# Patient Record
Sex: Female | Born: 2000 | Race: Black or African American | Hispanic: No | Marital: Single | Smoking: Never smoker
Health system: Southern US, Community
[De-identification: ages and names within clinical notes are randomized; demographics above are authoritative.]

## PROBLEM LIST (undated history)

## (undated) DIAGNOSIS — E119 Type 2 diabetes mellitus without complications: Secondary | ICD-10-CM

## (undated) HISTORY — DX: Type 2 diabetes mellitus without complications: E11.9

---
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
## 2020-02-02 ENCOUNTER — Ambulatory Visit: Payer: No Typology Code available for payment source | Attending: Internal Medicine

## 2020-02-02 DIAGNOSIS — Z23 Encounter for immunization: Secondary | ICD-10-CM

## 2020-02-02 NOTE — Progress Notes (Signed)
   Covid-19 Vaccination Clinic  Name:  Tammy Meyer    MRN: [id number] DOB: 03/27/2001  02/02/2020  [name] was observed post Covid-19 immunization for 15 minutes without incident. She was provided with Vaccine Information Sheet and instruction to access the V-Safe system.   [name] was instructed to call 911 with any severe reactions post vaccine: Marland Kitchen Difficulty breathing  . Swelling of face and throat  . A fast heartbeat  . A bad rash all over body  . Dizziness and weakness   Immunizations Administered    Name Date Dose VIS Date Route   Pfizer COVID-19 Vaccine 02/02/2020 10:52 AM 0.3 mL 12/05/2018 Intramuscular   Manufacturer: [employer], [employer]   Lot: [id number]   NDC: [address]-3

## 2020-02-25 ENCOUNTER — Ambulatory Visit: Payer: No Typology Code available for payment source | Attending: Internal Medicine

## 2020-02-25 DIAGNOSIS — Z23 Encounter for immunization: Secondary | ICD-10-CM

## 2020-02-25 NOTE — Progress Notes (Signed)
   Covid-19 Vaccination Clinic  Name:  Tammy Meyer    MRN: [id number] DOB: [birth year]  02/25/2020  Ms. [name] was observed post Covid-19 immunization for 15 minutes without incident. She was provided with Vaccine Information Sheet and instruction to access the V-Safe system.   Ms. [name] was instructed to call 911 with any severe reactions post vaccine: Marland Kitchen Difficulty breathing  . Swelling of face and throat  . A fast heartbeat  . A bad rash all over body  . Dizziness and weakness   Immunizations Administered    Name Date Dose VIS Date Route   Pfizer COVID-19 Vaccine 02/25/2020  2:12 PM 0.3 mL 12/05/2018 Intramuscular   Manufacturer: [employer], [employer]   Lot: [id number]   NDC: [address]-9

## 2020-08-15 DIAGNOSIS — Z68.41 Body mass index (BMI) pediatric, 85th percentile to less than 95th percentile for age: Secondary | ICD-10-CM | POA: Diagnosis not present

## 2021-06-07 IMAGING — DX DG CHEST 1V PORT
1 series · 1 of 1 positions shown · non-contrast
Comparison: None.

CLINICAL DATA: Chest pain.  Recent DPT8W-1V exposure.

EXAM:
PORTABLE CHEST 1 VIEW

[chest ap]
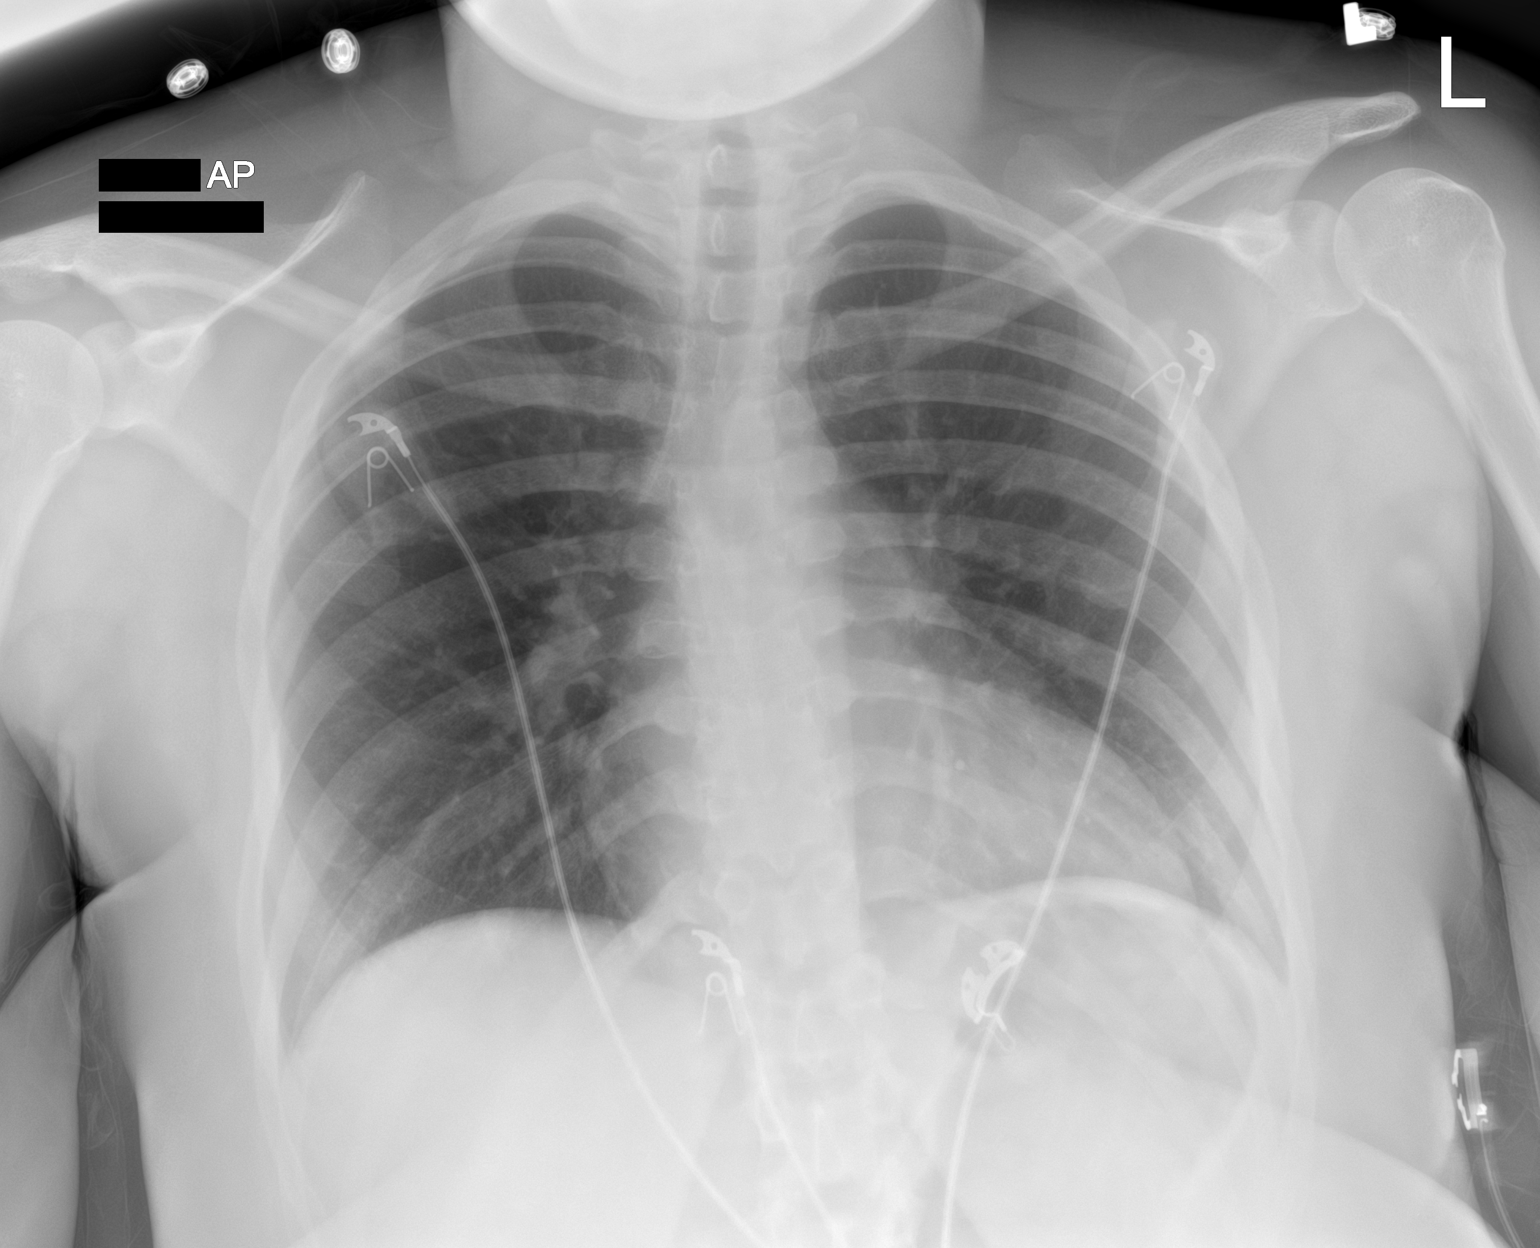

[1 of 1 positions shown; findings below may reference images not displayed]

FINDINGS: The heart size and mediastinal contours are within normal limits.
Both lungs are clear. The visualized skeletal structures are
unremarkable.
IMPRESSION: No active disease.

## 2021-09-28 ENCOUNTER — Ambulatory Visit (INDEPENDENT_AMBULATORY_CARE_PROVIDER_SITE_OTHER): Payer: Medicaid Other | Admitting: Women's Health

## 2021-09-28 ENCOUNTER — Encounter: Payer: Self-pay | Admitting: Women's Health

## 2021-09-28 ENCOUNTER — Other Ambulatory Visit: Payer: Self-pay

## 2021-09-28 VITALS — BP 109/70 | HR 84 | Ht 61.0 in | Wt 187.0 lb

## 2021-09-28 DIAGNOSIS — Z3202 Encounter for pregnancy test, result negative: Secondary | ICD-10-CM

## 2021-09-28 DIAGNOSIS — Z3009 Encounter for other general counseling and advice on contraception: Secondary | ICD-10-CM | POA: Diagnosis not present

## 2021-09-28 DIAGNOSIS — Z113 Encounter for screening for infections with a predominantly sexual mode of transmission: Secondary | ICD-10-CM

## 2021-09-28 LAB — POCT URINE PREGNANCY: Preg Test, Ur: NEGATIVE

## 2021-09-28 NOTE — Progress Notes (Signed)
° °  GYN VISIT Patient name: Tammy Meyer MRN [id number]  Date of birth: [birth year] Chief Complaint:   discuss IUD (Had sex yesterday)  History of Present Illness:   Tammy Meyer is a 20 y.o. G0 African-American female being seen today to discuss birth control. Wants IUD, discussed types, wants Mirena. Had sex yesterday, used a condom.    Patient's last menstrual period was 09/10/2021. The current method of family planning is condoms.  Last pap <21yo. Results were: N/A  Depression screen [facility] 2/9 09/28/2021  Decreased Interest 0  Down, Depressed, Hopeless 0  PHQ - 2 Score 0  Altered sleeping 0  Tired, decreased energy 0  Change in appetite 0  Feeling bad or failure about yourself  0  Trouble concentrating 0  Moving slowly or fidgety/restless 0  Suicidal thoughts 0  PHQ-9 Score 0     GAD 7 : Generalized Anxiety Score 09/28/2021  Nervous, Anxious, on Edge 0  Control/stop worrying 0  Worry too much - different things 0  Trouble relaxing 0  Restless 0  Easily annoyed or irritable 0  Afraid - awful might happen 0  Total GAD 7 Score 0     Review of Systems:   Pertinent items are noted in HPI Denies fever/chills, dizziness, headaches, visual disturbances, fatigue, shortness of breath, chest pain, abdominal pain, vomiting, abnormal vaginal discharge/itching/odor/irritation, problems with periods, bowel movements, urination, or intercourse unless otherwise stated above.  Pertinent History Reviewed:  Reviewed past medical,surgical, social, obstetrical and family history.  Reviewed problem list, medications and allergies. Physical Assessment:   Vitals:   09/28/21 1009  BP: 109/70  Pulse: 84  Weight: 187 lb (84.8 kg)  Height: 5\' 1"  (1.549 m)  Body mass index is 35.33 kg/m.       Physical Examination:   General appearance: alert, well appearing, and in no distress  Mental status: alert, oriented to person, place, and time  Skin: warm & dry   Cardiovascular: normal heart  rate noted  Respiratory: normal respiratory effort, no distress  Abdomen: soft, non-tender   Pelvic: examination not indicated  Extremities: no edema   Chaperone: N/A    Results for orders placed or performed in visit on 09/28/21 (from the past 24 hour(s))  POCT urine pregnancy   Collection Time: 09/28/21 10:22 AM  Result Value Ref Range   Preg Test, Ur Negative Negative    Assessment & Plan:  1) Contraception counseling> wants Mirena IUD, had sex yesterday, f/u 1/3 for IUD insertion, abstinence until then.   2) STD screen> gc/ct from urine  Meds: No orders of the defined types were placed in this encounter.   Orders Placed This Encounter  Procedures   GC/Chlamydia Probe Amp   POCT urine pregnancy    Return for 1/3 for IUD insertion.  09/30/21 CNM, [facility] 09/28/2021 10:44 AM

## 2021-09-28 NOTE — Patient Instructions (Signed)
NO SEX UNTIL AFTER YOU GET YOUR BIRTH CONTROL  

## 2021-09-30 LAB — GC/CHLAMYDIA PROBE AMP
Chlamydia trachomatis, NAA: NEGATIVE
Neisseria Gonorrhoeae by PCR: NEGATIVE

## 2021-10-19 ENCOUNTER — Ambulatory Visit (INDEPENDENT_AMBULATORY_CARE_PROVIDER_SITE_OTHER): Payer: Medicaid Other | Admitting: Women's Health

## 2021-10-19 ENCOUNTER — Other Ambulatory Visit: Payer: Self-pay

## 2021-10-19 ENCOUNTER — Encounter: Payer: Self-pay | Admitting: Women's Health

## 2021-10-19 VITALS — BP 134/73 | HR 88 | Ht 61.0 in | Wt 187.4 lb

## 2021-10-19 DIAGNOSIS — Z3202 Encounter for pregnancy test, result negative: Secondary | ICD-10-CM

## 2021-10-19 DIAGNOSIS — Z30430 Encounter for insertion of intrauterine contraceptive device: Secondary | ICD-10-CM | POA: Insufficient documentation

## 2021-10-19 LAB — POCT URINE PREGNANCY: Preg Test, Ur: NEGATIVE

## 2021-10-19 MED ORDER — LEVONORGESTREL 20 MCG/DAY IU IUD
1.0000 | INTRAUTERINE_SYSTEM | Freq: Once | INTRAUTERINE | Status: AC
  Administered 2021-10-19: 1 via INTRAUTERINE

## 2021-10-19 NOTE — Patient Instructions (Signed)
Nothing in vagina for 3 days (no sex, douching, tampons, etc...) Check your strings once a month to make sure you can feel them, if you are not able to please let us know If you develop a fever of 100.4 or more in the next few weeks, or if you develop severe abdominal pain, please let us know Use a backup method of birth control, such as condoms, for 2 weeks  Intrauterine Device Insertion, Care After This sheet gives you information about how to care for yourself after your procedure. Your health care provider may also give you more specific instructions. If you have problems or questions, contact your health care provider. What can I expect after the procedure? After the procedure, it is common to have: Cramps and pain in the abdomen. Bleeding. It may be light or heavy. This may last for a few days. Lower back pain. Dizziness. Headaches. Nausea. Follow these instructions at home:  Before resuming sexual activity, check to make sure that you can feel the IUD string or strings. You should be able to feel the end of the string below the opening of your cervix. If your IUD string is in place, you may resume sexual activity. If you had a hormonal IUD inserted more than 7 days after your most recent period started, you will need to use a backup method of birth control for 7 days after IUD insertion. Ask your health care provider whether this applies to you. Continue to check that the IUD is still in place by feeling for the strings after every menstrual period, or once a month. An IUD will not protect you from sexually transmitted infections (STIs). Use methods to prevent the exchange of body fluids between partners (barrier protection) every time you have sex. Barrier protection can be used during oral, vaginal, or anal sex. Commonly used barrier methods include: Female condom. Female condom. Dental dam. Take over-the-counter and prescription medicines only as told by your health care  provider. Keep all follow-up visits as told by your health care provider. This is important. Contact a health care provider if: You feel light-headed or weak. You have any of the following problems with your IUD string or strings: The string bothers or hurts you or your sexual partner. You cannot feel the string. The string has gotten longer. You can feel the IUD in your vagina. You think you may be pregnant, or you miss your menstrual period. You think you may have a sexually transmitted infection (STI). Get help right away if: You have flu-like symptoms, such as tiredness (fatigue) and muscle aches. You have a fever and chills. You have bleeding that is heavier or lasts longer than a normal menstrual cycle. You have abnormal or bad-smelling discharge from your vagina. You develop abdominal pain that is new, is getting worse, or is not in the same area of earlier cramping and pain. You have pain during sexual activity. Summary After the procedure, it is common to have cramps and pain in the abdomen. It is also common to have light bleeding or heavier bleeding that is like your menstrual period. Continue to check that the IUD is still in place by feeling for the strings after every menstrual period, or once a month. Keep all follow-up visits as told by your health care provider. This is important. Contact your health care provider if you have problems with your IUD strings, such as the string getting longer or bothering you or your sexual partner. This information is not intended   to replace advice given to you by your health care provider. Make sure you discuss any questions you have with your health care provider. Document Revised: 09/18/2019 Document Reviewed: 09/18/2019 Elsevier Patient Education  2022 Elsevier Inc.  

## 2021-10-19 NOTE — Progress Notes (Signed)
° °  IUD INSERTION Patient name: Tammy Meyer MRN [id number]  Date of birth: [birth year] Subjective Findings:   Tammy Meyer is a 21 y.o. G0P0000 African American female being seen today for insertion of a Mirena IUD.  Patient's last menstrual period was 10/12/2021 (exact date). Last sexual intercourse was 12/18 Last pap<21yo. Results were: N/A  The risks and benefits of the method and placement have been thouroughly reviewed with the patient and all questions were answered.  Specifically the patient is aware of failure rate of 10/998, expulsion of the IUD and of possible perforation.  The patient is aware of irregular bleeding due to the method and understands the incidence of irregular bleeding diminishes with time.  Signed copy of informed consent in chart.   Depression screen [facility] 2/9 09/28/2021  Decreased Interest 0  Down, Depressed, Hopeless 0  PHQ - 2 Score 0  Altered sleeping 0  Tired, decreased energy 0  Change in appetite 0  Feeling bad or failure about yourself  0  Trouble concentrating 0  Moving slowly or fidgety/restless 0  Suicidal thoughts 0  PHQ-9 Score 0     GAD 7 : Generalized Anxiety Score 09/28/2021  Nervous, Anxious, on Edge 0  Control/stop worrying 0  Worry too much - different things 0  Trouble relaxing 0  Restless 0  Easily annoyed or irritable 0  Afraid - awful might happen 0  Total GAD 7 Score 0     Pertinent History Reviewed:   Reviewed past medical,surgical, social, obstetrical and family history.  Reviewed problem list, medications and allergies. Objective Findings & Procedure:   Vitals:   10/19/21 1344  BP: 134/73  Pulse: 88  Weight: 187 lb 6.4 oz (85 kg)  Height: 5\' 1"  (1.549 m)  Body mass index is 35.41 kg/m.  Results for orders placed or performed in visit on 10/19/21 (from the past 24 hour(s))  POCT urine pregnancy   Collection Time: 10/19/21  1:45 PM  Result Value Ref Range   Preg Test, Ur Negative Negative     Time out was  performed.  A graves speculum was placed in the vagina.  The cervix was visualized, prepped using Betadine, and grasped with a single tooth tenaculum. The uterus was found to be neutral and it sounded to 7 cm.  Mirena  IUD placed per manufacturer's recommendations. The strings were trimmed to approximately 3 cm. The patient tolerated the procedure well.   Informal transabdominal sonogram was performed and the proper placement of the IUD was verified.  Chaperone: 12/17/21 & Plan:   1) Mirena IUD insertion The patient was given post procedure instructions, including signs and symptoms of infection and to check for the strings after each menses or each month, and refraining from intercourse or anything in the vagina for 3 days. She was given a care card with date IUD placed, and date IUD to be removed. She is scheduled for a f/u appointment in 4 weeks.  Orders Placed This Encounter  Procedures   POCT urine pregnancy    Return in about 4 weeks (around 11/16/2021) for IUD f/u, CNM, in person.  01/14/2022 CNM, [facility] 10/19/2021 2:01 PM

## 2021-11-16 ENCOUNTER — Ambulatory Visit (INDEPENDENT_AMBULATORY_CARE_PROVIDER_SITE_OTHER): Payer: Self-pay | Admitting: Women's Health

## 2021-11-16 ENCOUNTER — Other Ambulatory Visit: Payer: Self-pay

## 2021-11-16 ENCOUNTER — Encounter: Payer: Self-pay | Admitting: Women's Health

## 2021-11-16 VITALS — BP 125/75 | HR 92 | Ht 61.0 in | Wt 192.5 lb

## 2021-11-16 DIAGNOSIS — Z30431 Encounter for routine checking of intrauterine contraceptive device: Secondary | ICD-10-CM

## 2021-11-16 NOTE — Progress Notes (Signed)
° °  GYN VISIT Patient name: Tammy Meyer MRN [id number]  Date of birth: 09-27-2001 Chief Complaint:   Follow-up (IUD check)  History of Present Illness:   [name] is a 21 y.o. G0P0000 African-American female being seen today for IUD f/u. Mirena inserted 10/19/21. No problems since.  Patient's last menstrual period was 11/12/2021. The current method of family planning is IUD.  Last pap <21yo. Results were: N/A  Depression screen [facility] 2/9 09/28/2021  Decreased Interest 0  Down, Depressed, Hopeless 0  PHQ - 2 Score 0  Altered sleeping 0  Tired, decreased energy 0  Change in appetite 0  Feeling bad or failure about yourself  0  Trouble concentrating 0  Moving slowly or fidgety/restless 0  Suicidal thoughts 0  PHQ-9 Score 0     GAD 7 : Generalized Anxiety Score 09/28/2021  Nervous, Anxious, on Edge 0  Control/stop worrying 0  Worry too much - different things 0  Trouble relaxing 0  Restless 0  Easily annoyed or irritable 0  Afraid - awful might happen 0  Total GAD 7 Score 0     Review of Systems:   Pertinent items are noted in HPI Denies fever/chills, dizziness, headaches, visual disturbances, fatigue, shortness of breath, chest pain, abdominal pain, vomiting, abnormal vaginal discharge/itching/odor/irritation, problems with periods, bowel movements, urination, or intercourse unless otherwise stated above.  Pertinent History Reviewed:  Reviewed past medical,surgical, social, obstetrical and family history.  Reviewed problem list, medications and allergies. Physical Assessment:   Vitals:   11/16/21 1333  BP: 125/75  Pulse: 92  Weight: 192 lb 8 oz (87.3 kg)  Height: 5\' 1"  (1.549 m)  Body mass index is 36.37 kg/m.       Physical Examination:   General appearance: alert, well appearing, and in no distress  Mental status: alert, oriented to person, place, and time  Skin: warm & dry   Cardiovascular: normal heart rate noted  Respiratory: normal respiratory effort,  no distress  Abdomen: soft, non-tender   Pelvic: VULVA: normal appearing vulva with no masses, tenderness or lesions, VAGINA: normal appearing vagina with normal color and discharge, no lesions, CERVIX: normal appearing cervix without discharge or lesions, IUD strings visible, appropriate length  Extremities: no edema   Chaperone: Levy Pupa    No results found for this or any previous visit (from the past 24 hour(s)).  Assessment & Plan:  1) IUD check>  in place  Meds: No orders of the defined types were placed in this encounter.   No orders of the defined types were placed in this encounter.   Return for after 7/31 for , Pap & physical.  Roma Schanz CNM, [facility] 11/16/2021 1:52 PM

## 2022-05-11 ENCOUNTER — Ambulatory Visit (INDEPENDENT_AMBULATORY_CARE_PROVIDER_SITE_OTHER): Payer: Medicaid Other | Admitting: Women's Health

## 2022-05-11 ENCOUNTER — Other Ambulatory Visit (HOSPITAL_COMMUNITY)
Admission: RE | Admit: 2022-05-11 | Discharge: 2022-05-11 | Disposition: A | Discharge status: Home / Self Care | Payer: Medicaid Other | Source: Ambulatory Visit | Attending: Women's Health | Admitting: Women's Health

## 2022-05-11 ENCOUNTER — Encounter: Payer: Self-pay | Admitting: Women's Health

## 2022-05-11 VITALS — BP 125/77 | HR 101 | Ht 61.0 in | Wt 195.0 lb

## 2022-05-11 DIAGNOSIS — Z124 Encounter for screening for malignant neoplasm of cervix: Secondary | ICD-10-CM | POA: Diagnosis present

## 2022-05-11 DIAGNOSIS — R7303 Prediabetes: Secondary | ICD-10-CM | POA: Insufficient documentation

## 2022-05-11 DIAGNOSIS — Z113 Encounter for screening for infections with a predominantly sexual mode of transmission: Secondary | ICD-10-CM | POA: Diagnosis not present

## 2022-05-11 DIAGNOSIS — Z01419 Encounter for gynecological examination (general) (routine) without abnormal findings: Secondary | ICD-10-CM

## 2022-05-11 DIAGNOSIS — Z0000 Encounter for general adult medical examination without abnormal findings: Secondary | ICD-10-CM | POA: Diagnosis present

## 2022-05-11 NOTE — Progress Notes (Signed)
Pt denies diabetes DX, Elevated Hgb A1C 2016=6.0. Has not been checked since.

## 2022-05-11 NOTE — Progress Notes (Signed)
WELL-WOMAN EXAMINATION Patient name: Tammy Meyer MRN [id number]  Date of birth: 05/18/2001 Chief Complaint:   Gynecologic Exam  History of Present Illness:   Tammy Meyer is a 21 y.o. G0P0000 African-American female being seen today for a routine well-woman exam. Has FP Mcaid.  Current complaints: none  PCP: [facility]      does not desire labs, PCP checks, pre-diabetic Patient's last menstrual period was 04/19/2022. The current method of family planning is IUD, Mirena 10/19/21 Last pap never. Results were: N/A. H/O abnormal pap: no Last mammogram: never. Results were: N/A. Family h/o breast cancer: no Last colonoscopy: never. Results were: N/A. Family h/o colorectal cancer: no     09/28/2021   10:07 AM  Depression screen PHQ 2/9  Decreased Interest 0  Down, Depressed, Hopeless 0  PHQ - 2 Score 0  Altered sleeping 0  Tired, decreased energy 0  Change in appetite 0  Feeling bad or failure about yourself  0  Trouble concentrating 0  Moving slowly or fidgety/restless 0  Suicidal thoughts 0  PHQ-9 Score 0        09/28/2021   10:07 AM  GAD 7 : Generalized Anxiety Score  Nervous, Anxious, on Edge 0  Control/stop worrying 0  Worry too much - different things 0  Trouble relaxing 0  Restless 0  Easily annoyed or irritable 0  Afraid - awful might happen 0  Total GAD 7 Score 0     Review of Systems:   Pertinent items are noted in HPI Denies any headaches, blurred vision, fatigue, shortness of breath, chest pain, abdominal pain, abnormal vaginal discharge/itching/odor/irritation, problems with periods, bowel movements, urination, or intercourse unless otherwise stated above. Pertinent History Reviewed:  Reviewed past medical,surgical, social and family history.  Reviewed problem list, medications and allergies. Physical Assessment:   Vitals:   05/11/22 1452  BP: 125/77  Pulse: (!) 101  Weight: 195 lb (88.5 kg)  Height: 5\' 1"  (1.549 m)  Body mass index is 36.84  kg/m.        Physical Examination:   General appearance - well appearing, and in no distress  Mental status - alert, oriented to person, place, and time  Psych:  She has a normal mood and affect  Skin - warm and dry, normal color, no suspicious lesions noted  Chest - effort normal, all lung fields clear to auscultation bilaterally  Heart - normal rate and regular rhythm  Neck:  midline trachea, no thyromegaly or nodules  Breasts - breasts appear normal, no suspicious masses, no skin or nipple changes or  axillary nodes  Abdomen - soft, nontender, nondistended, no masses or organomegaly  Pelvic - VULVA: normal appearing vulva with no masses, tenderness or lesions  VAGINA: normal appearing vagina with normal color and discharge, no lesions  CERVIX: normal appearing cervix without discharge or lesions, no CMT, IUD strings visible  Thin prep pap is done w/ reflex HR HPV cotesting  UTERUS: uterus is felt to be normal size, shape, consistency and nontender   ADNEXA: No adnexal masses or tenderness noted.  Extremities:  No swelling or varicosities noted  Chaperone:  , RN     No results found for this or any previous visit (from the past 24 hour(s)).  Assessment & Plan:  1) Well-Woman Exam  2) STD screen  Labs/procedures today: pap & labs as below  Mammogram: @ 21yo, or sooner if problems Colonoscopy: @ 21yo, or sooner if problems  Orders Placed This  Encounter  Procedures   RPR   HIV Antibody (routine testing w rflx)    Meds: No orders of the defined types were placed in this encounter.   Follow-up: Return in about 1 year (around 05/12/2023) for Physical.  Cheral Marker CNM, WHNP-BC 05/11/2022 3:21 PM

## 2022-05-12 LAB — RPR: RPR Ser Ql: NONREACTIVE

## 2022-05-12 LAB — HIV ANTIBODY (ROUTINE TESTING W REFLEX): HIV Screen 4th Generation wRfx: NONREACTIVE

## 2022-05-13 LAB — CYTOLOGY - PAP
Chlamydia: NEGATIVE
Comment: NEGATIVE
Comment: NORMAL
Diagnosis: NEGATIVE
Neisseria Gonorrhea: NEGATIVE

## 2022-07-21 ENCOUNTER — Ambulatory Visit: Payer: Managed Care, Other (non HMO) | Admitting: Family Medicine

## 2022-07-21 DIAGNOSIS — R7303 Prediabetes: Secondary | ICD-10-CM

## 2022-07-21 NOTE — Patient Instructions (Signed)
Follow up annually.  Healthy diet; regular exercise.  Take care  Dr. Lacinda Axon

## 2022-07-22 DIAGNOSIS — Z0000 Encounter for general adult medical examination without abnormal findings: Secondary | ICD-10-CM | POA: Insufficient documentation

## 2022-07-22 NOTE — Assessment & Plan Note (Signed)
Patient has had a recent physical exam in May.  Advised to watch diet and exercise regularly to promote weight loss.

## 2022-07-22 NOTE — Progress Notes (Signed)
Subjective:  Patient ID: Tammy Meyer, female    DOB: 2001-07-04  Age: 21 y.o. MRN: [id number]  CC: Chief Complaint  Patient presents with   Establish Care    HPI:  21 year old female presents to establish care.  Patient is accompanied by her mother today.  Patient has no complaints.  Has IUD in place.  Married.  Not currently working or in school.  Pap smear screening up-to-date.  Has had prior HIV screening.  Declines vaccines today.  Has had labs obtained earlier this year.  These were done and may be a [facility].  CBC normal.  Metabolic panel normal.  T4S elevated at 5.7.  Normal lipid panel.  Patient Active Problem List   Diagnosis Date Noted   Annual physical exam 07/22/2022   Pre-diabetes 05/11/2022   Encounter for IUD insertion 10/19/2021    Social Hx   Social History   Socioeconomic History   Marital status: Single    Spouse name: Not on file   Number of children: Not on file   Years of education: Not on file   Highest education level: Not on file  Occupational History   Not on file  Tobacco Use   Smoking status: Never   Smokeless tobacco: Never  Vaping Use   Vaping Use: Never used  Substance and Sexual Activity   Alcohol use: Not Currently   Drug use: Never   Sexual activity: Yes    Birth control/protection: I.U.D., Condom  Other Topics Concern   Not on file  Social History Narrative   Not on file   Social Determinants of Health   Financial Resource Strain: Low Risk  (05/11/2022)   Overall Financial Resource Strain (CARDIA)    Difficulty of Paying Living Expenses: Not hard at all  Food Insecurity: No Food Insecurity (05/11/2022)   Hunger Vital Sign    Worried About Running Out of Food in the Last Year: Never true    [place] in the Last Year: Never true  Transportation Needs: No Transportation Needs (05/11/2022)   PRAPARE - Hydrologist (Medical): No    Lack of Transportation (Non-Medical): No  Physical  Activity: Insufficiently Active (05/11/2022)   Exercise Vital Sign    Days of Exercise per Week: 2 days    Minutes of Exercise per Session: 10 min  Stress: No Stress Concern Present (05/11/2022)   [place]    Feeling of Stress : Not at all  Social Connections: Moderately Isolated (05/11/2022)   Social Connection and Isolation Panel [NHANES]    Frequency of Communication with Friends and Family: More than three times a week    Frequency of Social Gatherings with Friends and Family: Once a week    Attends Religious Services: Never    Marine scientist or Organizations: No    Attends Archivist Meetings: Never    Marital Status: Living with partner    Review of Systems  Constitutional: Negative.   Respiratory: Negative.    Cardiovascular: Negative.    Objective:  BP 119/81   Pulse 93   Temp 98.2 F (36.8 C)   Ht 5' 1.5" (1.562 m)   Wt 194 lb (88 kg)   LMP 07/12/2022   SpO2 99%   BMI 36.06 kg/m      07/21/2022   10:09 AM 05/11/2022    2:52 PM 11/16/2021    1:33 PM  BP/Weight  Systolic BP 119 125 125  Diastolic BP 81 77 75  Wt. (Lbs) 194 195 192.5  BMI 36.06 kg/m2 36.84 kg/m2 36.37 kg/m2    Physical Exam Vitals and nursing note reviewed.  Constitutional:      Appearance: Normal appearance. She is obese.  HENT:     Head: Normocephalic and atraumatic.     Right Ear: Tympanic membrane normal.     Left Ear: Tympanic membrane normal.     Nose: Nose normal.     Mouth/Throat:     Pharynx: Oropharynx is clear.  Eyes:     General:        Right eye: No discharge.        Left eye: No discharge.     Conjunctiva/sclera: Conjunctivae normal.  Cardiovascular:     Rate and Rhythm: Normal rate and regular rhythm.  Pulmonary:     Effort: Pulmonary effort is normal.     Breath sounds: Normal breath sounds. No wheezing, rhonchi or rales.  Abdominal:     General: There is no distension.     Palpations:  Abdomen is soft.     Tenderness: There is no abdominal tenderness.  Neurological:     Mental Status: She is alert.  Psychiatric:        Behavior: Behavior normal.     Comments: Flat affect.    Assessment & Plan:   Problem List Items Addressed This Visit       Other   Pre-diabetes    Patient has had a recent physical exam in May.  Advised to watch diet and exercise regularly to promote weight loss.      Follow-up: Annually  Everlene Other DO [facility]

## 2023-08-10 ENCOUNTER — Ambulatory Visit (INDEPENDENT_AMBULATORY_CARE_PROVIDER_SITE_OTHER): Payer: Managed Care, Other (non HMO) | Admitting: Family Medicine

## 2023-08-10 VITALS — BP 117/79 | HR 110 | Temp 99.0°F | Ht 61.0 in | Wt 193.0 lb

## 2023-08-10 DIAGNOSIS — Z0000 Encounter for general adult medical examination without abnormal findings: Secondary | ICD-10-CM

## 2023-08-10 DIAGNOSIS — F419 Anxiety disorder, unspecified: Secondary | ICD-10-CM | POA: Insufficient documentation

## 2023-08-10 DIAGNOSIS — Z0001 Encounter for general adult medical examination with abnormal findings: Secondary | ICD-10-CM | POA: Diagnosis not present

## 2023-08-10 MED ORDER — SERTRALINE HCL 50 MG PO TABS: 50.0000 mg | ORAL_TABLET | Freq: Every day | ORAL | 1 refills | Status: DC

## 2023-08-10 NOTE — Progress Notes (Signed)
Subjective:  Patient ID: Tammy Meyer, female    DOB: 23-Jun-2001  Age: 22 y.o. MRN: [id number]  CC:  Annual exam   HPI:  22 year old female presents for an annual exam.  Patient states that she feels that she has had prior HPV vaccination.  Will have to wait this up and see how her.  Declines flu vaccine.  She is not sure whether she is going to get any COVID vaccines.  Has an IUD currently.  Is not currently sexually active.  She reports that she is having significant anxiety.  PHQ-9 score is 3.  GAD-7 score of 2.  Patient states that she has weekly bouts where she feels anxious and feels like it is hard to breathe.  This last for about 10 minutes and then resolves.  She states that this has been going on for a long time.  She has never taking any medication for this.  She states that this is quite distressing bothersome to her.  She would like to discuss treatment options.  Patient Active Problem List   Diagnosis Date Noted   Anxiety 08/10/2023   Annual physical exam 07/22/2022   Pre-diabetes 05/11/2022   Encounter for IUD insertion 10/19/2021    Social Hx   Social History   Socioeconomic History   Marital status: Single    Spouse name: Not on file   Number of children: Not on file   Years of education: Not on file   Highest education level: Not on file  Occupational History   Not on file  Tobacco Use   Smoking status: Never   Smokeless tobacco: Never  Vaping Use   Vaping status: Never Used  Substance and Sexual Activity   Alcohol use: Not Currently   Drug use: Never   Sexual activity: Yes    Birth control/protection: I.U.D., Condom  Other Topics Concern   Not on file  Social History Narrative   Not on file   Social Determinants of Health   Financial Resource Strain: Low Risk  (05/11/2022)   Overall Financial Resource Strain (CARDIA)    Difficulty of Paying Living Expenses: Not hard at all  Food Insecurity: No Food Insecurity (05/11/2022)   Hunger Vital Sign     Worried About Running Out of Food in the Last Year: Never true    Ran Out of Food in the Last Year: Never true  Transportation Needs: No Transportation Needs (05/11/2022)   PRAPARE - Administrator, Civil Service (Medical): No    Lack of Transportation (Non-Medical): No  Physical Activity: Insufficiently Active (05/11/2022)   Exercise Vital Sign    Days of Exercise per Week: 2 days    Minutes of Exercise per Session: 10 min  Stress: No Stress Concern Present (05/11/2022)   [employer] of Occupational Health - Occupational Stress Questionnaire    Feeling of Stress : Not at all  Social Connections: Unknown (02/23/2023)   Received from [employer]   Social Network    Social Network: Not on file    Review of Systems Per HPI  Objective:  BP 117/79   Pulse (!) 110   Temp 99 F (37.2 C) (Oral)   Ht 5\' 1"  (1.549 m)   Wt 193 lb (87.5 kg)   SpO2 94%   BMI 36.47 kg/m      08/10/2023    1:34 PM 07/21/2022   10:09 AM 05/11/2022    2:52 PM  BP/Weight  Systolic BP 117 119 125  Diastolic BP 79 81 77  Wt. (Lbs) 193 194 195  BMI 36.47 kg/m2 36.06 kg/m2 36.84 kg/m2    Physical Exam Vitals and nursing note reviewed.  Constitutional:      General: She is not in acute distress.    Appearance: Normal appearance.  HENT:     Head: Normocephalic and atraumatic.     Mouth/Throat:     Pharynx: Oropharynx is clear.  Eyes:     General:        Right eye: No discharge.        Left eye: No discharge.     Conjunctiva/sclera: Conjunctivae normal.  Cardiovascular:     Rate and Rhythm: Normal rate and regular rhythm.  Pulmonary:     Effort: Pulmonary effort is normal.     Breath sounds: Normal breath sounds. No wheezing, rhonchi or rales.  Musculoskeletal:     Cervical back: Neck supple. No tenderness.  Lymphadenopathy:     Cervical: No cervical adenopathy.  Neurological:     General: No focal deficit present.     Mental Status: She is alert.  Psychiatric:        Mood  and Affect: Mood normal.        Behavior: Behavior normal.     Lab Results  Component Value Date   CHOL 151 05/02/2015   TRIG 87 05/02/2015   HDL 37 (L) 05/02/2015   LDLCALC 97 05/02/2015   HGBA1C 6.0 (H) 05/02/2015     Assessment & Plan:   Problem List Items Addressed This Visit       Other   Anxiety    Discussed treatment options.  Patient elected for medication.  Starting counseling.  Follow-up in 6 weeks.      Relevant Medications   sertraline (ZOLOFT) 50 MG tablet   Annual physical exam - Primary    Discussed preventative healthcare items.  Healthcare maintenance updated today.       Meds ordered this encounter  Medications   sertraline (ZOLOFT) 50 MG tablet    Sig: Take 1 tablet (50 mg total) by mouth daily.    Dispense:  90 tablet    Refill:  1    Follow-up:  Return in about 6 weeks (around 09/21/2023) for Anxiety.  Everlene Other DO [facility]

## 2023-08-10 NOTE — Patient Instructions (Signed)
Medication as prescribed.  Follow up in 6 weeks. 

## 2023-08-10 NOTE — Assessment & Plan Note (Signed)
Discussed treatment options.  Patient elected for medication.  Starting counseling.  Follow-up in 6 weeks.

## 2023-08-10 NOTE — Assessment & Plan Note (Signed)
Discussed preventative healthcare items.  Healthcare maintenance updated today.

## 2023-09-21 ENCOUNTER — Ambulatory Visit: Payer: Managed Care, Other (non HMO) | Admitting: Family Medicine

## 2023-09-21 VITALS — BP 119/80 | HR 92 | Temp 97.3°F | Ht 61.0 in | Wt 188.2 lb

## 2023-09-21 DIAGNOSIS — F419 Anxiety disorder, unspecified: Secondary | ICD-10-CM

## 2023-09-21 NOTE — Patient Instructions (Signed)
Continue medication.  Follow-up annually.  If you decide you want to try a trial off, please let me know.

## 2023-09-21 NOTE — Progress Notes (Signed)
Subjective:  Patient ID: Tammy Meyer, female    DOB: March 28, 2001  Age: 22 y.o. MRN: [id number]  CC: Follow-up anxiety  HPI:  22 year old female presents for follow-up.  Patient states that she has had a significant improvement in her anxiety after starting Zoloft.  No side effects.  Doing well at this time.  No other complaints or concerns.  Patient Active Problem List   Diagnosis Date Noted   Anxiety 08/10/2023   Annual physical exam 07/22/2022   Pre-diabetes 05/11/2022   Encounter for IUD insertion 10/19/2021    Social Hx   Social History   Socioeconomic History   Marital status: Single    Spouse name: Not on file   Number of children: Not on file   Years of education: Not on file   Highest education level: GED or equivalent  Occupational History   Not on file  Tobacco Use   Smoking status: Never   Smokeless tobacco: Never  Vaping Use   Vaping status: Never Used  Substance and Sexual Activity   Alcohol use: Not Currently   Drug use: Never   Sexual activity: Yes    Birth control/protection: I.U.D., Condom  Other Topics Concern   Not on file  Social History Narrative   Not on file   Social Determinants of Health   Financial Resource Strain: Low Risk  (09/14/2023)   Overall Financial Resource Strain (CARDIA)    Difficulty of Paying Living Expenses: Not hard at all  Food Insecurity: No Food Insecurity (09/14/2023)   Hunger Vital Sign    Worried About Running Out of Food in the Last Year: Never true    Ran Out of Food in the Last Year: Never true  Transportation Needs: No Transportation Needs (09/14/2023)   PRAPARE - Administrator, Civil Service (Medical): No    Lack of Transportation (Non-Medical): No  Physical Activity: Sufficiently Active (09/14/2023)   Exercise Vital Sign    Days of Exercise per Week: 5 days    Minutes of Exercise per Session: 30 min  Stress: No Stress Concern Present (09/14/2023)   [employer] of Occupational Health -  Occupational Stress Questionnaire    Feeling of Stress : Not at all  Social Connections: Socially Isolated (09/14/2023)   Social Connection and Isolation Panel [NHANES]    Frequency of Communication with Friends and Family: Never    Frequency of Social Gatherings with Friends and Family: Never    Attends Religious Services: Never    Database administrator or Organizations: No    Attends Engineer, structural: Not on file    Marital Status: Separated    Review of Systems  Constitutional: Negative.   Respiratory: Negative.    Cardiovascular: Negative.     Objective:  BP 119/80   Pulse 92   Temp (!) 97.3 F (36.3 C)   Ht 5\' 1"  (1.549 m)   Wt 188 lb 3.2 oz (85.4 kg)   SpO2 99%   BMI 35.56 kg/m      09/21/2023    1:54 PM 08/10/2023    1:34 PM 07/21/2022   10:09 AM  BP/Weight  Systolic BP 119 117 119  Diastolic BP 80 79 81  Wt. (Lbs) 188.2 193 194  BMI 35.56 kg/m2 36.47 kg/m2 36.06 kg/m2    Physical Exam Vitals and nursing note reviewed.  Constitutional:      General: She is not in acute distress.    Appearance: Normal appearance. She is obese.  Cardiovascular:     Rate and Rhythm: Normal rate and regular rhythm.  Pulmonary:     Effort: Pulmonary effort is normal.     Breath sounds: Normal breath sounds. No wheezing or rales.  Neurological:     Mental Status: She is alert.  Psychiatric:        Mood and Affect: Mood normal.        Behavior: Behavior normal.     Lab Results  Component Value Date   CHOL 151 05/02/2015   TRIG 87 05/02/2015   HDL 37 (L) 05/02/2015   LDLCALC 97 05/02/2015   HGBA1C 6.0 (H) 05/02/2015     Assessment & Plan:   Problem List Items Addressed This Visit       Other   Anxiety - Primary    Improved.  Continue Zoloft.      Follow-up:  Return in about 1 year (around 09/20/2024).  Everlene Other DO [facility]

## 2023-09-21 NOTE — Assessment & Plan Note (Signed)
Improved.  Continue Zoloft. 

## 2024-02-05 ENCOUNTER — Other Ambulatory Visit: Payer: Self-pay | Admitting: Family Medicine

## 2024-03-24 ENCOUNTER — Encounter: Payer: Self-pay | Admitting: Family Medicine

## 2024-03-26 ENCOUNTER — Other Ambulatory Visit: Payer: Self-pay | Admitting: Family Medicine

## 2024-03-26 MED ORDER — SERTRALINE HCL 100 MG PO TABS: 100.0000 mg | ORAL_TABLET | Freq: Every day | ORAL | 3 refills | Status: AC
